# Patient Record
Sex: Male | Born: 1985 | Hispanic: Yes | Marital: Single | Smoking: Current every day smoker
Health system: Southern US, Community
[De-identification: ages and names within clinical notes are randomized; demographics above are authoritative.]

## PROBLEM LIST (undated history)

## (undated) HISTORY — PX: SHOULDER SURGERY: SHX246

---

## 2017-12-01 ENCOUNTER — Encounter (HOSPITAL_COMMUNITY): Payer: Self-pay | Admitting: Emergency Medicine

## 2017-12-01 ENCOUNTER — Emergency Department (HOSPITAL_COMMUNITY)
Admission: EM | Admit: 2017-12-01 | Discharge: 2017-12-01 | Disposition: A | Discharge status: Home / Self Care | Payer: Self-pay | Attending: Emergency Medicine | Admitting: Emergency Medicine

## 2017-12-01 ENCOUNTER — Emergency Department (HOSPITAL_COMMUNITY): Payer: Self-pay

## 2017-12-01 DIAGNOSIS — M25511 Pain in right shoulder: Secondary | ICD-10-CM | POA: Insufficient documentation

## 2017-12-01 DIAGNOSIS — R202 Paresthesia of skin: Secondary | ICD-10-CM | POA: Insufficient documentation

## 2017-12-01 MED ORDER — HYDROCODONE-ACETAMINOPHEN 5-325 MG PO TABS
2.0000 | ORAL_TABLET | Freq: Once | ORAL | Status: AC
  Administered 2017-12-01: 2 via ORAL
  Filled 2017-12-01: qty 2

## 2017-12-01 MED ORDER — IBUPROFEN 800 MG PO TABS: 800.0000 mg | ORAL_TABLET | Freq: Three times a day (TID) | ORAL | 0 refills | Status: AC | PRN
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
## 2017-12-01 NOTE — Discharge Instructions (Signed)
You were seen in the emergency department for an injury to your right shoulder.  There is no obvious fracture or dislocation.  We are giving you a splint and prescribing some ibuprofen as an anti-inflammatory to help with pain.  You are also having some numbness in your second and third finger on the right hand.  There is possibly some nerve injury and you will need to follow-up with the orthopedic doctor.  Please return to the emergency department if you have any worsening symptoms.

## 2017-12-11 ENCOUNTER — Emergency Department (HOSPITAL_COMMUNITY): Payer: Self-pay

## 2017-12-11 ENCOUNTER — Other Ambulatory Visit: Payer: Self-pay

## 2017-12-11 ENCOUNTER — Encounter (HOSPITAL_COMMUNITY): Payer: Self-pay

## 2017-12-11 ENCOUNTER — Emergency Department (HOSPITAL_COMMUNITY)
Admission: EM | Admit: 2017-12-11 | Discharge: 2017-12-11 | Disposition: A | Discharge status: Home / Self Care | Payer: Self-pay | Attending: Emergency Medicine | Admitting: Emergency Medicine

## 2017-12-11 DIAGNOSIS — F17210 Nicotine dependence, cigarettes, uncomplicated: Secondary | ICD-10-CM | POA: Insufficient documentation

## 2017-12-11 DIAGNOSIS — R109 Unspecified abdominal pain: Secondary | ICD-10-CM

## 2017-12-11 DIAGNOSIS — R112 Nausea with vomiting, unspecified: Secondary | ICD-10-CM | POA: Insufficient documentation

## 2017-12-11 DIAGNOSIS — R197 Diarrhea, unspecified: Secondary | ICD-10-CM | POA: Insufficient documentation

## 2017-12-11 LAB — COMPREHENSIVE METABOLIC PANEL
ALT: 25 U/L (ref 17–63)
AST: 27 U/L (ref 15–41)
Albumin: 4.2 g/dL (ref 3.5–5.0)
Alkaline Phosphatase: 103 U/L (ref 38–126)
Anion gap: 8 (ref 5–15)
BUN: 12 mg/dL (ref 6–20)
CHLORIDE: 104 mmol/L (ref 101–111)
CO2: 26 mmol/L (ref 22–32)
Calcium: 9.4 mg/dL (ref 8.9–10.3)
Creatinine, Ser: 0.86 mg/dL (ref 0.61–1.24)
Glucose, Bld: 93 mg/dL (ref 65–99)
POTASSIUM: 3.9 mmol/L (ref 3.5–5.1)
SODIUM: 138 mmol/L (ref 135–145)
Total Bilirubin: 1.3 mg/dL — ABNORMAL HIGH (ref 0.3–1.2)
Total Protein: 7.7 g/dL (ref 6.5–8.1)

## 2017-12-11 LAB — ABO/RH: ABO/RH(D): O POS

## 2017-12-11 LAB — TYPE AND SCREEN
ABO/RH(D): O POS
Antibody Screen: NEGATIVE

## 2017-12-11 LAB — CBC
HEMATOCRIT: 44.6 % (ref 39.0–52.0)
HEMOGLOBIN: 15.7 g/dL (ref 13.0–17.0)
MCH: 32.9 pg (ref 26.0–34.0)
MCHC: 35.2 g/dL (ref 30.0–36.0)
MCV: 93.5 fL (ref 78.0–100.0)
Platelets: 279 10*3/uL (ref 150–400)
RBC: 4.77 MIL/uL (ref 4.22–5.81)
RDW: 12.9 % (ref 11.5–15.5)
WBC: 7.5 10*3/uL (ref 4.0–10.5)

## 2017-12-11 MED ORDER — ONDANSETRON 8 MG PO TBDP: 8.0000 mg | ORAL_TABLET | Freq: Three times a day (TID) | ORAL | 0 refills | Status: AC | PRN

## 2017-12-11 MED ORDER — IOPAMIDOL (ISOVUE-300) INJECTION 61%
100.0000 mL | Freq: Once | INTRAVENOUS | Status: AC | PRN
  Administered 2017-12-11: 100 mL via INTRAVENOUS

## 2017-12-11 MED ORDER — ONDANSETRON HCL 4 MG/2ML IJ SOLN
4.0000 mg | Freq: Once | INTRAMUSCULAR | Status: AC
  Administered 2017-12-11: 4 mg via INTRAVENOUS
  Filled 2017-12-11: qty 2

## 2017-12-11 MED ORDER — MORPHINE SULFATE (PF) 4 MG/ML IV SOLN
4.0000 mg | Freq: Once | INTRAVENOUS | Status: AC
  Administered 2017-12-11: 4 mg via INTRAVENOUS
  Filled 2017-12-11: qty 1

## 2017-12-11 MED ORDER — SODIUM CHLORIDE 0.9 % IV BOLUS (SEPSIS)
1000.0000 mL | Freq: Once | INTRAVENOUS | Status: AC
  Administered 2017-12-11: 1000 mL via INTRAVENOUS

## 2017-12-11 MED ORDER — IOPAMIDOL (ISOVUE-300) INJECTION 61%
INTRAVENOUS | Status: AC
  Filled 2017-12-11: qty 100

## 2017-12-11 MED ORDER — SODIUM CHLORIDE 0.9 % IJ SOLN
INTRAMUSCULAR | Status: AC
  Filled 2017-12-11: qty 50

## 2017-12-11 NOTE — ED Triage Notes (Signed)
Patient c/o mid abdominal pain x 2 days. Patient states he has been having bright red blood in stool and emesis.

## 2017-12-11 NOTE — ED Provider Notes (Signed)
[facility]-EMERGENCY DEPT Provider Note   CSN: [id number] Arrival date & time: 12/11/17  0846     History   Chief Complaint Chief Complaint  Patient presents with  . GI Bleeding  . Emesis  . Diarrhea  . Abdominal Pain    HPI Kristopher Attwood- Howell Pringle is a 32 y.o. male.  HPI Patient is a 32 year old male presents the emergency department with mid abdominal pain over the past 48 hours with associated nausea vomiting and diarrhea.  He reports a small amount of blood in his vomit and a small amount of blood in his stool.  No prior history of GI bleed.  No history of cirrhosis.  Reports his stools mainly watery with trace amount of blood.  No vomiting of blood while in the emergency department.  Denies fevers and chills.  No recent sick contacts.  Reports abdominal pain is crampy and generalized.  No chest pain or shortness of breath.   History reviewed. No pertinent past medical history.  There are no active problems to display for this patient.   Past Surgical History:  Procedure Laterality Date  . SHOULDER SURGERY         Home Medications    Prior to Admission medications   Medication Sig Start Date End Date Taking? Authorizing Provider  ibuprofen (ADVIL,MOTRIN) 800 MG tablet Take 1 tablet (800 mg total) by mouth every 8 (eight) hours as needed. 12/01/17  Yes Terrilee Files, MD  ondansetron (ZOFRAN ODT) 8 MG disintegrating tablet Take 1 tablet (8 mg total) by mouth every 8 (eight) hours as needed for nausea or vomiting. 12/11/17   Azalia Bilis, MD    Family History Family History  Problem Relation Age of Onset  . Diabetes Mother     Social History Social History   Tobacco Use  . Smoking status: Current Every Day Smoker    Packs/day: 0.15    Types: Cigarettes  . Smokeless tobacco: Current User  Substance Use Topics  . Alcohol use: Yes    Frequency: Never    Comment: only weekends  . Drug use: No     Allergies    Penicillins   Review of Systems Review of Systems  All other systems reviewed and are negative.    Physical Exam Updated Vital Signs BP (!) 122/94   Pulse 77   Temp 98.1 F (36.7 C) (Oral)   Resp 17   Ht 5\' 7"  (1.702 m)   Wt 76.7 kg (169 lb)   SpO2 99%   BMI 26.47 kg/m   Physical Exam  Constitutional: He is oriented to person, place, and time. He appears well-developed and well-nourished.  HENT:  Head: Normocephalic and atraumatic.  Eyes: EOM are normal.  Neck: Normal range of motion.  Cardiovascular: Normal rate, regular rhythm, normal heart sounds and intact distal pulses.  Pulmonary/Chest: Effort normal and breath sounds normal. No respiratory distress.  Abdominal: Soft. He exhibits no distension.  Mild generalized abdominal tenderness  Musculoskeletal: Normal range of motion.  Neurological: He is alert and oriented to person, place, and time.  Skin: Skin is warm and dry.  Psychiatric: He has a normal mood and affect. Judgment normal.  Nursing note and vitals reviewed.    ED Treatments / Results  Labs (all labs ordered are listed, but only abnormal results are displayed) Labs Reviewed  COMPREHENSIVE METABOLIC PANEL - Abnormal; Notable for the following components:      Result Value   Total Bilirubin 1.3 (*)  All other components within normal limits  CBC  POC OCCULT BLOOD, ED  TYPE AND SCREEN  ABO/RH    EKG  EKG Interpretation None       Radiology Ct Abdomen Pelvis W Contrast  Result Date: 12/11/2017 CLINICAL DATA:  Mid abdominal pain for 2 days, some bright red blood in stool, some vomiting EXAM: CT ABDOMEN AND PELVIS WITH CONTRAST TECHNIQUE: Multidetector CT imaging of the abdomen and pelvis was performed using the standard protocol following bolus administration of intravenous contrast. CONTRAST:  100mL ISOVUE-300 IOPAMIDOL (ISOVUE-300) INJECTION 61% COMPARISON:  None. FINDINGS: Lower chest: The lung bases are clear. The heart is within normal  limits in size. Hepatobiliary: The liver is slightly low in attenuation which may indicate mild fatty infiltration. No ductal dilatation is seen. No gallstones are seen within the gallbladder. Pancreas: The pancreas is normal in size and the pancreatic duct is not dilated. Spleen: The spleen is within normal limits in size. Adrenals/Urinary Tract: The adrenal glands appear symmetrical and normal. The kidneys enhance with no calculus or mass. No hydronephrosis is seen. The ureters are normal in course to the bladder and the urinary bladder is unremarkable. Stomach/Bowel: The stomach is only slightly distended with fluid. No abnormality is seen. No small bowel distention or edema is evident. There are a few scattered rectosigmoid colon diverticula present. The colon is largely decompressed and difficult to assess. However no obvious mass or colonic mucosal edema is seen. No diverticulitis is noted. The terminal ileum and the appendix are well visualized with no abnormality noted. Vascular/Lymphatic: The abdominal aorta is normal in caliber. No adenopathy is seen. Reproductive: The prostate gland is normal in size. Other: None. Musculoskeletal: The lumbar vertebrae are in normal alignment with normal intervertebral disc spaces. No compression deformity is seen. The SI joints appear corticated. IMPRESSION: 1. No explanation for the patient's symptoms is seen. No edema of large or small bowel is seen. 2. Only a few scattered rectosigmoid colon diverticula are noted. There is no present evidence of diverticulitis. 3. The appendix and terminal ileum are unremarkable. 4. Question mild fatty infiltration of the liver. Electronically Signed   By: Dwyane DeePaul  Barry M.D.   On: 12/11/2017 11:58    Procedures Procedures (including critical care time)  Medications Ordered in ED Medications  iopamidol (ISOVUE-300) 61 % injection (not administered)  sodium chloride 0.9 % injection (not administered)  sodium chloride 0.9 % bolus  1,000 mL (1,000 mLs Intravenous New Bag/Given 12/11/17 1041)  ondansetron (ZOFRAN) injection 4 mg (4 mg Intravenous Given 12/11/17 1041)  morphine 4 MG/ML injection 4 mg (4 mg Intravenous Given 12/11/17 1041)  iopamidol (ISOVUE-300) 61 % injection 100 mL (100 mLs Intravenous Contrast Given 12/11/17 1125)     Initial Impression / Assessment and Plan / ED Course  I have reviewed the triage vital signs and the nursing notes.  Pertinent labs & imaging results that were available during my care of the patient were reviewed by me and considered in my medical decision making (see chart for details).     Overall well-appearing.  Vital signs stable.  Hemoglobin stable.  No vomiting of blood while in the emergency department.  No passing of blood via rectum in the emergency department.  CT scan without concerning findings.  Hemoglobin is 15.7.  Patient understands return to the ER for new or worsening symptoms.  Home with Zofran.  Final Clinical Impressions(s) / ED Diagnoses   Final diagnoses:  Nausea vomiting and diarrhea  Acute abdominal pain  ED Discharge Orders        Ordered    ondansetron (ZOFRAN ODT) 8 MG disintegrating tablet  Every 8 hours PRN     12/11/17 1208       Azalia Bilis, MD 12/11/17 1211

## 2019-08-06 IMAGING — CR DG SHOULDER 2+V*R*
2 series · 2 of 2 positions shown · non-contrast
Comparison: None.

CLINICAL DATA: Fall yesterday.  Shoulder surgery 12 years ago.

EXAM:
RIGHT SHOULDER - 2+ VIEW

[w shoulder external right]
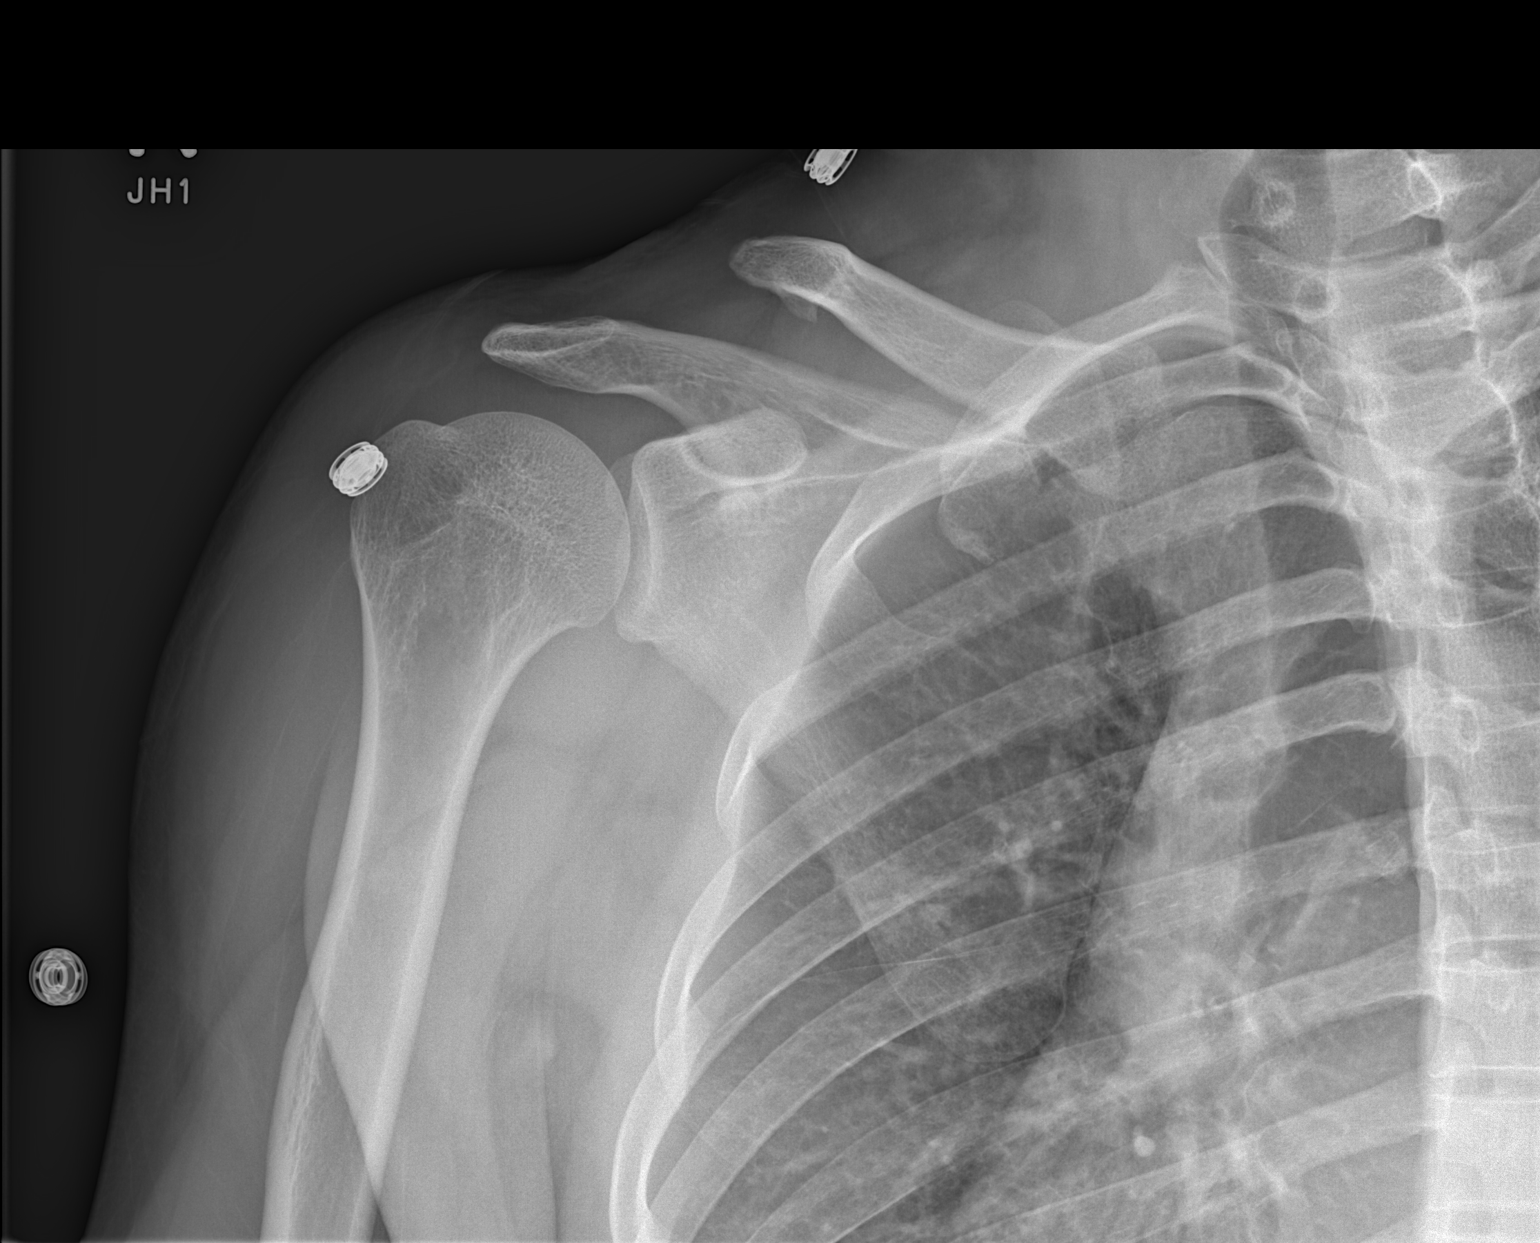

[w shoulder y-view right]
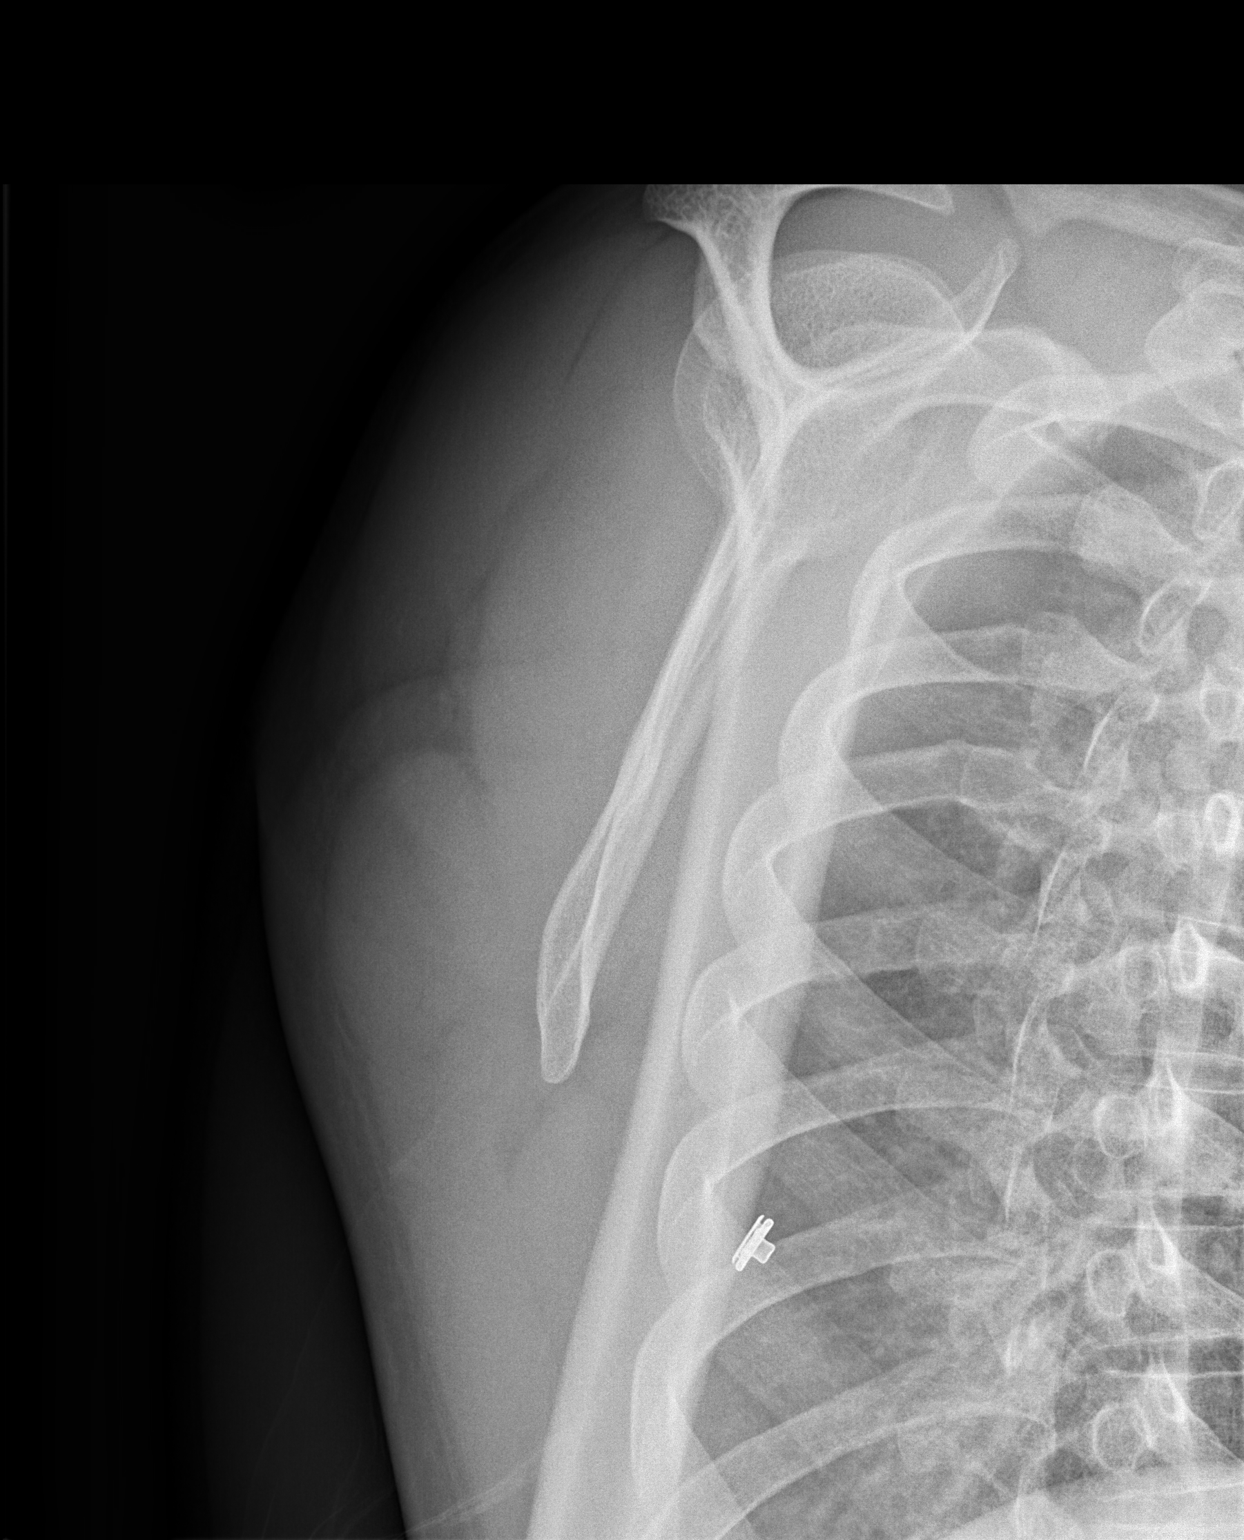

[2 of 2 positions shown; findings below may reference images not displayed]

FINDINGS: Foreshortening of the clavicle is likely due to previous surgery. No
acute fracture identified in the clavicle. The scapula and humerus
are intact. No shoulder fracture or dislocation.
IMPRESSION: Postsurgical changes in the distal clavicle.  No acute abnormality.
